# Patient Record
Sex: Female | Born: 1980 | Race: Black or African American | Hispanic: No | Marital: Single | Smoking: Current some day smoker
Health system: Southern US, Community
[De-identification: ages and names within clinical notes are randomized; demographics above are authoritative.]

## PROBLEM LIST (undated history)

## (undated) DIAGNOSIS — N83209 Unspecified ovarian cyst, unspecified side: Secondary | ICD-10-CM

## (undated) DIAGNOSIS — N159 Renal tubulo-interstitial disease, unspecified: Secondary | ICD-10-CM

---

## 2005-07-12 ENCOUNTER — Emergency Department (HOSPITAL_COMMUNITY): Admission: EM | Admit: 2005-07-12 | Discharge: 2005-07-12 | Payer: Self-pay | Admitting: Family Medicine

## 2005-07-17 ENCOUNTER — Ambulatory Visit: Payer: Self-pay | Admitting: Internal Medicine

## 2006-04-14 ENCOUNTER — Emergency Department (HOSPITAL_COMMUNITY): Admission: EM | Admit: 2006-04-14 | Discharge: 2006-04-14 | Payer: Self-pay | Admitting: Family Medicine

## 2006-07-16 ENCOUNTER — Other Ambulatory Visit: Admission: RE | Admit: 2006-07-16 | Discharge: 2006-07-16 | Payer: Self-pay | Admitting: Family Medicine

## 2006-09-17 ENCOUNTER — Inpatient Hospital Stay (HOSPITAL_COMMUNITY): Admission: AD | Admit: 2006-09-17 | Discharge: 2006-09-17 | Payer: Self-pay | Admitting: Obstetrics and Gynecology

## 2007-03-04 ENCOUNTER — Emergency Department (HOSPITAL_COMMUNITY): Admission: EM | Admit: 2007-03-04 | Discharge: 2007-03-04 | Payer: Self-pay | Admitting: Family Medicine

## 2007-04-01 ENCOUNTER — Inpatient Hospital Stay (HOSPITAL_COMMUNITY): Admission: AD | Admit: 2007-04-01 | Discharge: 2007-04-01 | Payer: Self-pay | Admitting: Obstetrics and Gynecology

## 2007-06-30 DIAGNOSIS — N83209 Unspecified ovarian cyst, unspecified side: Secondary | ICD-10-CM

## 2007-06-30 HISTORY — DX: Unspecified ovarian cyst, unspecified side: N83.209

## 2007-07-15 ENCOUNTER — Emergency Department (HOSPITAL_COMMUNITY): Admission: EM | Admit: 2007-07-15 | Discharge: 2007-07-15 | Payer: Self-pay | Admitting: Emergency Medicine

## 2007-11-03 ENCOUNTER — Emergency Department (HOSPITAL_COMMUNITY): Admission: EM | Admit: 2007-11-03 | Discharge: 2007-11-03 | Payer: Self-pay | Admitting: Family Medicine

## 2008-06-19 ENCOUNTER — Emergency Department (HOSPITAL_COMMUNITY): Admission: EM | Admit: 2008-06-19 | Discharge: 2008-06-19 | Payer: Self-pay | Admitting: Emergency Medicine

## 2008-06-29 DIAGNOSIS — N159 Renal tubulo-interstitial disease, unspecified: Secondary | ICD-10-CM

## 2008-06-29 HISTORY — DX: Renal tubulo-interstitial disease, unspecified: N15.9

## 2009-01-15 ENCOUNTER — Inpatient Hospital Stay (HOSPITAL_COMMUNITY): Admission: EM | Admit: 2009-01-15 | Discharge: 2009-01-19 | Payer: Self-pay | Admitting: Emergency Medicine

## 2009-02-27 ENCOUNTER — Emergency Department (HOSPITAL_COMMUNITY): Admission: EM | Admit: 2009-02-27 | Discharge: 2009-02-27 | Payer: Self-pay | Admitting: Family Medicine

## 2009-07-25 ENCOUNTER — Emergency Department (HOSPITAL_COMMUNITY): Admission: EM | Admit: 2009-07-25 | Discharge: 2009-07-25 | Payer: Self-pay | Admitting: Emergency Medicine

## 2010-10-05 LAB — COMPREHENSIVE METABOLIC PANEL
AST: 18 U/L (ref 0–37)
Albumin: 2.6 g/dL — ABNORMAL LOW (ref 3.5–5.2)
Albumin: 3.7 g/dL (ref 3.5–5.2)
Alkaline Phosphatase: 57 U/L (ref 39–117)
BUN: 5 mg/dL — ABNORMAL LOW (ref 6–23)
CO2: 25 mEq/L (ref 19–32)
CO2: 26 mEq/L (ref 19–32)
Calcium: 9.3 mg/dL (ref 8.4–10.5)
Creatinine, Ser: 1.01 mg/dL (ref 0.4–1.2)
Glucose, Bld: 86 mg/dL (ref 70–99)
Total Bilirubin: 0.8 mg/dL (ref 0.3–1.2)
Total Bilirubin: 1 mg/dL (ref 0.3–1.2)
Total Protein: 6.3 g/dL (ref 6.0–8.3)

## 2010-10-05 LAB — URINE CULTURE: Colony Count: >=100000

## 2010-10-05 LAB — CULTURE, BLOOD (ROUTINE X 2): Culture: NO GROWTH

## 2010-10-05 LAB — DIFFERENTIAL
Basophils Absolute: 0 10*3/uL (ref 0.0–0.1)
Basophils Absolute: 0 10*3/uL (ref 0.0–0.1)
Basophils Relative: 0 % (ref 0–1)
Eosinophils Absolute: 0 10*3/uL (ref 0.0–0.7)
Eosinophils Absolute: 0.2 10*3/uL (ref 0.0–0.7)
Eosinophils Absolute: 0.5 10*3/uL (ref 0.0–0.7)
Eosinophils Relative: 0 % (ref 0–5)
Eosinophils Relative: 4 % (ref 0–5)
Lymphocytes Relative: 10 % — ABNORMAL LOW (ref 12–46)
Lymphocytes Relative: 14 % (ref 12–46)
Lymphocytes Relative: 16 % (ref 12–46)
Lymphs Abs: 1.5 10*3/uL (ref 0.7–4.0)
Lymphs Abs: 2 10*3/uL (ref 0.7–4.0)
Lymphs Abs: 2.1 10*3/uL (ref 0.7–4.0)
Lymphs Abs: 2.2 10*3/uL (ref 0.7–4.0)
Monocytes Absolute: 1.1 10*3/uL — ABNORMAL HIGH (ref 0.1–1.0)
Monocytes Absolute: 1.1 10*3/uL — ABNORMAL HIGH (ref 0.1–1.0)
Monocytes Absolute: 1.5 10*3/uL — ABNORMAL HIGH (ref 0.1–1.0)
Monocytes Relative: 10 % (ref 3–12)
Monocytes Relative: 10 % (ref 3–12)
Neutro Abs: 11.7 10*3/uL — ABNORMAL HIGH (ref 1.7–7.7)
Neutro Abs: 8.5 10*3/uL — ABNORMAL HIGH (ref 1.7–7.7)
Neutrophils Relative %: 75 % (ref 43–77)

## 2010-10-05 LAB — URINALYSIS, ROUTINE W REFLEX MICROSCOPIC
Glucose, UA: NEGATIVE mg/dL
Specific Gravity, Urine: 1.018 (ref 1.005–1.030)
Urobilinogen, UA: >8 mg/dL — ABNORMAL HIGH (ref 0.0–1.0)
pH: 6.5 (ref 5.0–8.0)

## 2010-10-05 LAB — HEMOGLOBIN A1C: Hgb A1c MFr Bld: 5.2 % (ref 4.6–6.1)

## 2010-10-05 LAB — BASIC METABOLIC PANEL
BUN: <1 mg/dL — ABNORMAL LOW (ref 6–23)
CO2: 26 mEq/L (ref 19–32)
Chloride: 109 mEq/L (ref 96–112)
Glucose, Bld: 90 mg/dL (ref 70–99)
Potassium: 3.4 mEq/L — ABNORMAL LOW (ref 3.5–5.1)

## 2010-10-05 LAB — CBC
HCT: 30.4 % — ABNORMAL LOW (ref 36.0–46.0)
HCT: 31.6 % — ABNORMAL LOW (ref 36.0–46.0)
HCT: 40.8 % (ref 36.0–46.0)
Hemoglobin: 10.5 g/dL — ABNORMAL LOW (ref 12.0–15.0)
Hemoglobin: 10.7 g/dL — ABNORMAL LOW (ref 12.0–15.0)
MCHC: 33.8 g/dL (ref 30.0–36.0)
MCHC: 34.6 g/dL (ref 30.0–36.0)
MCV: 92.2 fL (ref 78.0–100.0)
MCV: 93.1 fL (ref 78.0–100.0)
MCV: 93.6 fL (ref 78.0–100.0)
Platelets: 226 10*3/uL (ref 150–400)
Platelets: 251 10*3/uL (ref 150–400)
RBC: 3.37 MIL/uL — ABNORMAL LOW (ref 3.87–5.11)
RBC: 4.36 MIL/uL (ref 3.87–5.11)
RDW: 14.1 % (ref 11.5–15.5)
WBC: 12 10*3/uL — ABNORMAL HIGH (ref 4.0–10.5)
WBC: 15.6 10*3/uL — ABNORMAL HIGH (ref 4.0–10.5)
WBC: 15.9 10*3/uL — ABNORMAL HIGH (ref 4.0–10.5)

## 2010-10-05 LAB — TSH: TSH: 1.747 u[IU]/mL (ref 0.350–4.500)

## 2010-11-11 NOTE — Discharge Summary (Signed)
Jillian [name], [name]           ACCOUNT NO.:  [id number]   MEDICAL RECORD NO.:  [id number]          PATIENT TYPE:  INP   LOCATION:  5522                         FACILITY:  [facility]   PHYSICIAN:  Altha Harm, MDDATE OF BIRTH:  [birth year]   DATE OF ADMISSION:  01/14/2009  DATE OF DISCHARGE:  01/19/2009                               DISCHARGE SUMMARY   DISCHARGE DISPOSITION:  Home.   DISCHARGE DIAGNOSES:  1. Acute E. coli pyelonephritis.  2. Acute E. coli bacteremia.  3. Multilobar pneumonia.  4. Hypoxia, resolved.  5. Broken blood vessel in the left conjunctiva.  6. Obesity, BMI greater than 40.   DISCHARGE MEDICATIONS:  Include the following:  1. Ciprofloxacin 500 mg p.o. q.12 h x9 days.  2. Azithromycin 500 mg p.o. daily x2 days.   CONSULTANTS:  None.   PROCEDURES:  None.   DIAGNOSTIC STUDIES:  1. Chest x-ray two view done on second day of admission which shows      bilateral lower lobe infiltrates which are likely due to multifocal      infection.  Followup imaging recommended to ensure resolution.  2. Two view chest x-ray done on July 24 on day of discharge which      shows increased opacification in the lateral right lung base.      Persistent opacification in the retrocardiac space.  Findings were      either from air space disease or pneumonia.   CODE STATUS:  Full code.   ALLERGIES:  No known drug allergies.   CHIEF COMPLAINT:  Back pain and fever.   HISTORY OF PRESENT ILLNESS:  Please refer to the H and P by Dr. Oswald Hillock for details of the HPI.  However, in short, this is a 30 year old  [place] female who presented with complaints of 2 day history  of back pain associated with dysuria, vomiting, fever and chills.   HOSPITAL COURSE:  1. The patient was diagnosed on admission with acute pyelonephritis.      She was started on ciprofloxacin for treatment of her disease.      This was changed to cefepime and then to Rocephin.  Cultures  revealed that she had E. coli that was sensitive to both Rocephin      and to ciprofloxacin.  The patient was transitioned to oral      ciprofloxacin to complete a total of 9 additional days for a total      of 14 days in light of her bacteremia with the same pathogen.  2. E. coli bacteremia.  As noted the E. coli was sensitive to both      Rocephin and ciprofloxacin.  The patient is being continued on      ciprofloxacin for a total of 14 days total.  3. Multifocal pneumonia.  The patient is being treated with      ciprofloxacin.  In addition she had azithromycin added for atypical      coverage.  The patient has completed 3 days of azithromycin and is      to complete an additional 2 days.  4. Transient hypertension.  The patient did have some values which      reflected hypertension.  However, the patient was in a state of      excitement at the time and a repeat blood pressure shows her blood      pressures to be 130.88.  Otherwise the patient has remained stable.   DIETARY RESTRICTIONS:  None.   PHYSICAL RESTRICTIONS:  None.   FOLLOWUP:  The patient is to follow up with urgent care in about a week  for repeat chest x-ray and further evaluation.  Total time for this  visit and discharge process 50 minutes.      Altha Harm, MD  Electronically Signed     MAM/MEDQ  D:  01/19/2009  T:  01/19/2009  Job:  [id number]

## 2010-11-11 NOTE — H&P (Signed)
Jillian [name], Jillian [name]           ACCOUNT NO.:  [id number]   MEDICAL RECORD NO.:  [id number]          PATIENT TYPE:  INP   LOCATION:  5522                         FACILITY:  [facility]   PHYSICIAN:  Oswald Hillock, MD        DATE OF BIRTH:  [birth year]   DATE OF ADMISSION:  01/14/2009  DATE OF DISCHARGE:                              HISTORY & PHYSICAL   CHIEF COMPLAINT:  Back pain/fever.   HISTORY OF PRESENT ILLNESS:  The patient is a 29 year old African  American female, who presents to the emergency room with a 2-day history  of back pain associated with dysuria, decrease in amount of her  urination, vomiting, fever, and chills.  She states that she has never  had symptoms like this in the past.  Denies any diarrhea, abdominal  pain, or any chest pain, shortness of breath, palpitations, dizziness,  loss of consciousness, or any focal weakness of any part of the body.   EMERGENCY ROOM COURSE:  The patient had an initial eval in the ER, which  revealed positive urinalysis.  She had a white count of 16,000 and was  febrile with temperature of 102.8.  She was subsequently started on IV  fluids and given a dose of Rocephin in the ER.  The patient however  continued to be febrile, and we were asked to admit her for further  evaluation and management.   PAST MEDICAL HISTORY:  Significant for:  1. History of abnormal Pap smear.  2. History of obesity.  3. History of headaches.  4. History of chlamydia infection.   PAST SURGICAL HISTORY:  Colposcopy of the cervix.   FAMILY HISTORY:  Significant for diabetes.   SOCIAL HISTORY:  The patient denies any alcohol, tobacco, or drug use.   CURRENT MEDICATIONS:  None.   ALLERGIES:  No known drug allergies.   REVIEW OF SYSTEMS:  An extensive review of systems was done and all  systems are negative except for the positives as mentioned in the  history of present illness.   PHYSICAL EXAMINATION:  VITAL SIGNS:  On admission, pulse of 115, blood  pressure 126/80, temperature of 102.8, her O2 sats of 100% on room air.  GENERAL:  The patient is conscious; alert and oriented to time, place,  and person; febrile in no significant distress at the time of this  interview.  HEENT:  No scleral icterus.  No pallor.  Ears, negative.  Poor dental  hygiene.  NECK:  Supple.  No lymphadenopathy.  No JVD.  No carotid bruits.  CHEST:  Clear bilaterally.  CVS:  S1 and S2 plus regular.  No tachycardia.  No gallop, rub, or  murmur appreciated.  ABDOMEN:  Obese, nontender.  Bowel sounds present.  No  hepatosplenomegaly.  BACK:  The patient has bilateral costovertebral tenderness.  EXTREMITIES:  No cyanosis, clubbing, or edema.  NEUROLOGIC:  Cranial nerves II through XII are grossly intact.  No focal  motor or sensory deficits noted on gross examination.   LABORATORY DATA:  Urinalysis revealed positive nitrites, large leuk  esterase, too numerous to count wbc's.  WBC count was 15.9,  hemoglobin  13.8, platelet count of 251, neutrophils 84%, bands 13%.  Sodium 134,  potassium 2.4, chloride 99, CO2 of 25, glucose 86, BUN 5, and creatinine  1.11, calcium of 9.3, lipase was 16.   IMPRESSION AND PLAN:  This is a case of a 30 year old [place] American  female, who presents with 2-day history of fever, dysuria, and vomiting  and is noted to have what is likely acute pyelonephritis.  1. Acute pyelonephritis.  Given her continued fever, we will admit her      to the Medical Service for further evaluation and management.  We      will start her on intravenous ciprofloxacin, follow up with culture      and sensitivity results.  Blood cultures will be drawn if they have      not been done earlier.  The patient will be maintained on IV fluids      with potassium as she is mildly hypokalemic, and we will monitor      her closely.  If she continues to be febrile, we may need to do a      CT of her abdomen and pelvis to rule out any possible renal       abnormalities including renal microabscesses.  2. Health maintenance.  We will check TSH and hemoglobin A1c and      follow up with the results.  3. Deep venous thrombosis/gastrointestinal prophylaxis.  Protonix and      subcutaneous heparin.      Oswald Hillock, MD  Electronically Signed     Oswald Hillock, MD  Electronically Signed    BA/MEDQ  D:  01/15/2009  T:  01/15/2009  Job:  [id number]   cc:   Melvern Banker

## 2011-03-19 LAB — URINALYSIS, ROUTINE W REFLEX MICROSCOPIC
Bilirubin Urine: NEGATIVE
Glucose, UA: NEGATIVE
Ketones, ur: NEGATIVE
Protein, ur: NEGATIVE

## 2011-03-19 LAB — GC/CHLAMYDIA PROBE AMP, GENITAL
Chlamydia, DNA Probe: NEGATIVE
GC Probe Amp, Genital: NEGATIVE

## 2011-03-19 LAB — WET PREP, GENITAL
Trich, Wet Prep: NONE SEEN
Yeast Wet Prep HPF POC: NONE SEEN

## 2011-04-09 LAB — URINALYSIS, ROUTINE W REFLEX MICROSCOPIC
Leukocytes, UA: NEGATIVE
Nitrite: NEGATIVE
Protein, ur: NEGATIVE
Specific Gravity, Urine: <1.005 — ABNORMAL LOW
Urobilinogen, UA: 0.2

## 2011-04-09 LAB — URINE MICROSCOPIC-ADD ON

## 2011-04-09 LAB — CBC
Hemoglobin: 13
MCHC: 33.9
Platelets: 336
RDW: 13.2

## 2011-04-09 LAB — WET PREP, GENITAL: Yeast Wet Prep HPF POC: NONE SEEN

## 2011-04-09 LAB — TYPE AND SCREEN
ABO/RH(D): A POS
Antibody Screen: NEGATIVE

## 2011-04-09 LAB — GC/CHLAMYDIA PROBE AMP, GENITAL
Chlamydia, DNA Probe: NEGATIVE
GC Probe Amp, Genital: NEGATIVE

## 2011-04-09 LAB — HCG, QUANTITATIVE, PREGNANCY: hCG, Beta Chain, Quant, S: <2

## 2011-04-10 LAB — POCT URINALYSIS DIP (DEVICE)
Nitrite: NEGATIVE
Protein, ur: NEGATIVE
pH: 6

## 2012-03-22 DIAGNOSIS — A5901 Trichomonal vulvovaginitis: Secondary | ICD-10-CM | POA: Insufficient documentation

## 2012-03-23 ENCOUNTER — Emergency Department (HOSPITAL_COMMUNITY)
Admission: EM | Admit: 2012-03-23 | Discharge: 2012-03-23 | Disposition: A | Discharge status: Home / Self Care | Payer: Self-pay | Attending: Emergency Medicine | Admitting: Emergency Medicine

## 2012-03-23 ENCOUNTER — Encounter (HOSPITAL_COMMUNITY): Payer: Self-pay | Admitting: Emergency Medicine

## 2012-03-23 DIAGNOSIS — A599 Trichomoniasis, unspecified: Secondary | ICD-10-CM

## 2012-03-23 DIAGNOSIS — B9689 Other specified bacterial agents as the cause of diseases classified elsewhere: Secondary | ICD-10-CM

## 2012-03-23 HISTORY — DX: Unspecified ovarian cyst, unspecified side: N83.209

## 2012-03-23 HISTORY — DX: Renal tubulo-interstitial disease, unspecified: N15.9

## 2012-03-23 LAB — GC/CHLAMYDIA PROBE AMP, GENITAL: GC Probe Amp, Genital: NEGATIVE

## 2012-03-23 LAB — URINALYSIS, ROUTINE W REFLEX MICROSCOPIC
Glucose, UA: NEGATIVE mg/dL
Hgb urine dipstick: NEGATIVE
Ketones, ur: NEGATIVE mg/dL
Leukocytes, UA: NEGATIVE
Protein, ur: NEGATIVE mg/dL
pH: 5.5 (ref 5.0–8.0)

## 2012-03-23 LAB — WET PREP, GENITAL: Yeast Wet Prep HPF POC: NONE SEEN

## 2012-03-23 LAB — RPR: RPR Ser Ql: NONREACTIVE

## 2012-03-23 MED ORDER — METRONIDAZOLE 500 MG PO TABS
2000.0000 mg | ORAL_TABLET | Freq: Once | ORAL | Status: AC
  Administered 2012-03-23: 2000 mg via ORAL
  Filled 2012-03-23: qty 4

## 2012-03-23 MED ORDER — AZITHROMYCIN 250 MG PO TABS
1000.0000 mg | ORAL_TABLET | Freq: Once | ORAL | Status: AC
  Administered 2012-03-23: 1000 mg via ORAL
  Filled 2012-03-23: qty 4

## 2012-03-23 MED ORDER — CEFTRIAXONE SODIUM 250 MG IJ SOLR
250.0000 mg | Freq: Once | INTRAMUSCULAR | Status: AC
  Administered 2012-03-23: 250 mg via INTRAMUSCULAR
  Filled 2012-03-23: qty 250

## 2012-03-23 MED ORDER — ACETAMINOPHEN 325 MG PO TABS
650.0000 mg | ORAL_TABLET | Freq: Once | ORAL | Status: AC
  Administered 2012-03-23: 650 mg via ORAL
  Filled 2012-03-23: qty 2
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
## 2012-03-23 NOTE — ED Provider Notes (Signed)
History     CSN: [id number]  Arrival date & time 03/22/12  2325   First MD Initiated Contact with Patient 03/23/12 0129      Chief Complaint  Patient presents with  . Flank Pain    (Consider location/radiation/quality/duration/timing/severity/associated sxs/prior treatment) HPI  Pt to the ER with complaints of left lower abdominal pain and urinary urgency. Her pain is 8/10. SHe says that her bf was recently told by her bf that he cheated on her and she is afraid that she may have an STD. She denies having vaginal discharge. NO N/V/D/F, chills weakness or confusion. VSS/NAD.  Past Medical History  Diagnosis Date  . Ovarian cyst rupture 2009  . Kidney infection 2010    History reviewed. No pertinent past surgical history.  History reviewed. No pertinent family history.  History  Substance Use Topics  . Smoking status: Current Some Day Smoker  . Smokeless tobacco: Not on file   Comment: 3 cigarettes / day  . Alcohol Use: No    OB History    Grav Para Term Preterm Abortions TAB SAB Ect Mult Living                  Review of Systems   Review of Systems  Gen: no weight loss, fevers, chills, night sweats  Eyes: no discharge or drainage, no occular pain or visual changes  Nose: no epistaxis or rhinorrhea  Mouth: no dental pain, no sore throat  Neck: no neck pain  Lungs:No wheezing, coughing or hemoptysis CV: no chest pain, palpitations, dependent edema or orthopnea  Abd: no abdominal pain, nausea, vomiting  GU: no dysuria or gross hematuria, + urinary frequency MSK:  No abnormalities  Neuro: no headache, no focal neurologic deficits  Skin: no abnormalities Psyche: negative.    Allergies  Review of patient's allergies indicates no known allergies.  Home Medications   Current Outpatient Rx  Name Route Sig Dispense Refill  . ASPIRIN-SALICYLAMIDE-CAFFEINE 650-195-33.3 MG PO PACK Oral Take 1 Package by mouth every 8 (eight) hours as needed. Pain    . PRENATAL  MULTIVITAMIN CH Oral Take 1 tablet by mouth daily.      BP 126/76  Pulse 78  Temp 98.4 F (36.9 C) (Oral)  Resp 24  Ht 5\' 3"  (1.6 m)  SpO2 100%  LMP 03/11/2012  Physical Exam  Nursing note and vitals reviewed. Constitutional: She appears well-developed and well-nourished. No distress.  HENT:  Head: Normocephalic and atraumatic.  Eyes: Pupils are equal, round, and reactive to light.  Neck: Normal range of motion. Neck supple.  Cardiovascular: Normal rate and regular rhythm.   Pulmonary/Chest: Effort normal.  Abdominal: Soft.  Genitourinary: Uterus normal. Cervix exhibits no motion tenderness, no discharge and no friability. Right adnexum displays no mass, no tenderness and no fullness. Left adnexum displays no mass, no tenderness and no fullness. No erythema, tenderness or bleeding around the vagina. No foreign body around the vagina. No signs of injury around the vagina. Vaginal discharge found.  Neurological: She is alert.  Skin: Skin is warm and dry.    ED Course  Procedures (including critical care time)  Labs Reviewed  URINALYSIS, ROUTINE W REFLEX MICROSCOPIC - Abnormal; Notable for the following:    APPearance CLOUDY (*)     All other components within normal limits  WET PREP, GENITAL - Abnormal; Notable for the following:    Trich, Wet Prep MANY (*)     Clue Cells Wet Prep HPF POC FEW (*)  WBC, Wet Prep HPF POC FEW (*)     All other components within normal limits  GC/CHLAMYDIA PROBE AMP, GENITAL  RPR   No results found.   1. Trichimoniasis   2. BV (bacterial vaginosis)       MDM  + trich, + WBC  and + clue cells. Pt treated in ED with rocephin 250mg  IM, 2mg  PO flagyl and 1g PO Azithro. Told she will be called in 2 days if GC or syph tests come back positive.  Pt has been advised of the symptoms that warrant their return to the ED. Patient has voiced understanding and has agreed to follow-up with the PCP or specialist.         Dorthula Matas,  PA 03/23/12 0345

## 2012-03-23 NOTE — ED Notes (Signed)
Abd/ back pain x 1 week.

## 2012-03-24 NOTE — ED Provider Notes (Signed)
Medical screening examination/treatment/procedure(s) were performed by non-physician practitioner and as supervising physician I was immediately available for consultation/collaboration.  Dilyn Osoria, MD 03/24/12 0024 

## 2012-06-12 ENCOUNTER — Emergency Department (HOSPITAL_COMMUNITY): Payer: No Typology Code available for payment source

## 2012-06-12 ENCOUNTER — Emergency Department (HOSPITAL_COMMUNITY)
Admission: EM | Admit: 2012-06-12 | Discharge: 2012-06-12 | Disposition: A | Discharge status: Home / Self Care | Payer: No Typology Code available for payment source | Attending: Emergency Medicine | Admitting: Emergency Medicine

## 2012-06-12 ENCOUNTER — Encounter (HOSPITAL_COMMUNITY): Payer: Self-pay | Admitting: *Deleted

## 2012-06-12 DIAGNOSIS — M7918 Myalgia, other site: Secondary | ICD-10-CM

## 2012-06-12 DIAGNOSIS — M25519 Pain in unspecified shoulder: Secondary | ICD-10-CM | POA: Insufficient documentation

## 2012-06-12 DIAGNOSIS — Z8742 Personal history of other diseases of the female genital tract: Secondary | ICD-10-CM | POA: Insufficient documentation

## 2012-06-12 DIAGNOSIS — Y92410 Unspecified street and highway as the place of occurrence of the external cause: Secondary | ICD-10-CM | POA: Insufficient documentation

## 2012-06-12 DIAGNOSIS — F17200 Nicotine dependence, unspecified, uncomplicated: Secondary | ICD-10-CM | POA: Insufficient documentation

## 2012-06-12 DIAGNOSIS — Z87448 Personal history of other diseases of urinary system: Secondary | ICD-10-CM | POA: Insufficient documentation

## 2012-06-12 DIAGNOSIS — R209 Unspecified disturbances of skin sensation: Secondary | ICD-10-CM | POA: Insufficient documentation

## 2012-06-12 DIAGNOSIS — Y9389 Activity, other specified: Secondary | ICD-10-CM | POA: Insufficient documentation

## 2012-06-12 MED ORDER — OXYCODONE-ACETAMINOPHEN 5-325 MG PO TABS: ORAL_TABLET | ORAL | Status: DC

## 2012-06-12 NOTE — ED Notes (Signed)
Pt escorted to discharge window. Pt verbalized understanding discharge instructions. In no acute distress.  
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
## 2012-06-12 NOTE — ED Provider Notes (Signed)
Medical screening examination/treatment/procedure(s) were performed by non-physician practitioner and as supervising physician I was immediately available for consultation/collaboration.  Gerhard Munch, MD 06/12/12 [phone]

## 2012-10-15 ENCOUNTER — Encounter (HOSPITAL_COMMUNITY): Payer: Self-pay | Admitting: Emergency Medicine

## 2012-10-15 ENCOUNTER — Emergency Department (HOSPITAL_COMMUNITY)
Admission: EM | Admit: 2012-10-15 | Discharge: 2012-10-15 | Disposition: A | Discharge status: Home / Self Care | Payer: BC Managed Care – PPO | Source: Home / Self Care

## 2012-10-15 DIAGNOSIS — Z189 Retained foreign body fragments, unspecified material: Secondary | ICD-10-CM

## 2012-10-15 DIAGNOSIS — H44731 Retained (nonmagnetic) (old) foreign body in lens, right eye: Secondary | ICD-10-CM

## 2012-10-15 DIAGNOSIS — S0550XA Penetrating wound with foreign body of unspecified eyeball, initial encounter: Secondary | ICD-10-CM

## 2012-10-15 MED ORDER — TETRACAINE HCL 0.5 % OP SOLN
OPHTHALMIC | Status: AC
  Filled 2012-10-15: qty 2

## 2012-10-15 MED ORDER — POLYMYXIN B-TRIMETHOPRIM 10000-0.1 UNIT/ML-% OP SOLN: 1.0000 [drp] | OPHTHALMIC | Status: DC

## 2012-10-15 NOTE — ED Provider Notes (Signed)
Medical screening examination/treatment/procedure(s) were performed by non-physician practitioner and as supervising physician I was immediately available for consultation/collaboration.  Reyana Leisey, M.D.  Rashea Hoskie C Maygen Sirico, MD 10/15/12 1857 

## 2012-10-15 NOTE — ED Notes (Signed)
Pt c/o poss contact lens lost inside right eye "feels like an eye lash is stuck inside her eye" Denies blurry vision, f/v/n/d  She is alert and oriented w/no signs of acute distress.

## 2012-10-15 NOTE — ED Provider Notes (Signed)
History     CSN: [id number]  Arrival date & time 10/15/12  1234   None     Chief Complaint  Patient presents with  . Foreign Body in Eye    (Consider location/radiation/quality/duration/timing/severity/associated sxs/prior treatment) HPI Comments: 32 year old female states that 2 days ago he she believed her right eye contact was loose in her eye. She contents that the sensation of the contact remains in her eye. She is here to have it removed. She denies pain or vision changes. She states it feels like contact is moving around in her eye.   Past Medical History  Diagnosis Date  . Ovarian cyst rupture 2009  . Kidney infection 2010    History reviewed. No pertinent past surgical history.  No family history on file.  History  Substance Use Topics  . Smoking status: Current Some Day Smoker  . Smokeless tobacco: Not on file     Comment: 3 cigarettes / day  . Alcohol Use: No    OB History   Grav Para Term Preterm Abortions TAB SAB Ect Mult Living                  Review of Systems  Constitutional: Negative.   HENT: Negative.   Eyes: Positive for redness. Negative for pain, discharge, itching and visual disturbance.  Gastrointestinal: Negative.   Psychiatric/Behavioral: The patient is nervous/anxious.     Allergies  Review of patient's allergies indicates no known allergies.  Home Medications   Current Outpatient Rx  Name  Route  Sig  Dispense  Refill  . ibuprofen (ADVIL,MOTRIN) 200 MG tablet   Oral   Take 400 mg by mouth every 6 (six) hours as needed. PAIN         . oxyCODONE-acetaminophen (PERCOCET/ROXICET) 5-325 MG per tablet      1 to 2 tabs PO q6hrs  PRN for pain   15 tablet   0   . trimethoprim-polymyxin b (POLYTRIM) ophthalmic solution   Right Eye   Place 1 drop into the right eye every 4 (four) hours.   10 mL   0     BP 123/73  Pulse 89  Temp(Src) 98.2 F (36.8 C) (Oral)  Resp 16  SpO2 100%  LMP 09/27/2012  Physical Exam  Nursing  note and vitals reviewed. Constitutional: She is oriented to person, place, and time. She appears well-developed and well-nourished. No distress.  Eyes:  Minor conjunctival erythema and scleral injection. The upper lid was everted and no foreign body seen under magnification. The same to the lower lid. The entire globe was examined prior to staining and after staining. No uptake observed no foreign body or contact lens observed.  Cardiovascular: Normal rate.   Pulmonary/Chest: Effort normal.  Neurological: She is alert and oriented to person, place, and time.  Skin: Skin is warm and dry.    ED Course  Irrigation Date/Time: 10/15/2012 3:20 PM Performed by: Phineas Real Sunny Aguon Authorized by: Phineas Real, Elvena Oyer Consent: Verbal consent obtained. Risks and benefits: risks, benefits and alternatives were discussed Consent given by: patient Patient understanding: patient states understanding of the procedure being performed Patient identity confirmed: arm band Local anesthesia used: yes Local anesthetic: topical anesthetic Patient sedated: no Comments: Post fluoriscine  application the right eye was irrigated with eyewash. The patient had a difficult time cooperating with the irrigation as she was quite anxious and fearful of something in her eye. I was able to urinate with approximately 30-40 cc prior to the patient stating that  she could not tolerate it anymore. A note regarding observation under for seen. No uptake visualized. No abrasions observed. No foreign body uptake such as a contact me and. No foreign body visualized.   (including critical care time)  Labs Reviewed - No data to display No results found.   1. 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 After applying fluorescing stain and under magnification again, no foreign body or contact lens was observed. Polytrim one drop in the right eye every 4 hours. For any worsening new symptoms or  problems may return, otherwise followup with your optometrist early this week.        Hayden Rasmussen, NP 10/15/12 [phone]

## 2013-01-08 ENCOUNTER — Emergency Department (HOSPITAL_COMMUNITY)
Admission: EM | Admit: 2013-01-08 | Discharge: 2013-01-08 | Disposition: A | Discharge status: Home / Self Care | Payer: BC Managed Care – PPO | Source: Home / Self Care

## 2013-01-08 ENCOUNTER — Encounter (HOSPITAL_COMMUNITY): Payer: Self-pay | Admitting: *Deleted

## 2013-01-08 DIAGNOSIS — G44209 Tension-type headache, unspecified, not intractable: Secondary | ICD-10-CM

## 2013-01-08 LAB — POCT URINALYSIS DIP (DEVICE)
Glucose, UA: NEGATIVE mg/dL
Hgb urine dipstick: NEGATIVE
Nitrite: NEGATIVE
Urobilinogen, UA: 0.2 mg/dL (ref 0.0–1.0)
pH: 7.5 (ref 5.0–8.0)

## 2013-01-08 MED ORDER — BUTALBITAL-APAP-CAFFEINE 50-325-40 MG PO TABS: 1.0000 | ORAL_TABLET | Freq: Four times a day (QID) | ORAL | Status: DC | PRN

## 2013-01-08 MED ORDER — KETOROLAC TROMETHAMINE 60 MG/2ML IM SOLN
60.0000 mg | Freq: Once | INTRAMUSCULAR | Status: AC
  Administered 2013-01-08: 60 mg via INTRAMUSCULAR

## 2013-01-08 MED ORDER — KETOROLAC TROMETHAMINE 60 MG/2ML IM SOLN
INTRAMUSCULAR | Status: AC
Start: 2013-01-08 — End: 2013-01-08
  Filled 2013-01-08: qty 2

## 2013-01-08 NOTE — ED Provider Notes (Signed)
History    CSN: [id number] Arrival date & time 01/08/13  1310  First MD Initiated Contact with Patient 01/08/13 1409     Chief Complaint  Patient presents with  . Urinary Tract Infection  . Headache   (Consider location/radiation/quality/duration/timing/severity/associated sxs/prior Treatment) HPI Comments: 32 year old female complaining of headache for one week. It has been intermittent however for the past 3 days but worse. Seemed to start out in the posterior cervical musculature, radiate superiorly to the occiput and into the vertex and frontal lobes. She denies problems with speech, hearing, swallowing or vision. Denies focal paresthesias or weakness. Denies photophobia, nausea, vomiting or abdominal pain. No history of migraine-type headaches.  Second complaint is that her urine has a bad odor. And is cloudy. She denies dysuria, urinary frequency or urgency.  Past Medical History  Diagnosis Date  . Ovarian cyst rupture 2009  . Kidney infection 2010   History reviewed. No pertinent past surgical history. No family history on file. History  Substance Use Topics  . Smoking status: Current Some Day Smoker  . Smokeless tobacco: Not on file     Comment: 3 cigarettes / day  . Alcohol Use: No   OB History   Grav Para Term Preterm Abortions TAB SAB Ect Mult Living                 Review of Systems  Constitutional: Negative.  Negative for fever.  HENT: Negative for congestion, sore throat, rhinorrhea, trouble swallowing and postnasal drip.   Respiratory: Negative.   Gastrointestinal: Negative.  Negative for nausea, vomiting and abdominal pain.  Genitourinary: Negative for dysuria, frequency and pelvic pain.  Musculoskeletal: Positive for back pain.  Skin: Negative.   Neurological: Positive for headaches. Negative for dizziness, tremors, seizures, syncope, facial asymmetry, speech difficulty, weakness and numbness.    Allergies  Review of patient's allergies indicates no  known allergies.  Home Medications   Current Outpatient Rx  Name  Route  Sig  Dispense  Refill  . ibuprofen (ADVIL,MOTRIN) 200 MG tablet   Oral   Take 400 mg by mouth every 6 (six) hours as needed. PAIN         . butalbital-acetaminophen-caffeine (FIORICET) 50-325-40 MG per tablet   Oral   Take 1-2 tablets by mouth every 6 (six) hours as needed for pain or headache.   6 tablet   0   . oxyCODONE-acetaminophen (PERCOCET/ROXICET) 5-325 MG per tablet      1 to 2 tabs PO q6hrs  PRN for pain   15 tablet   0   . trimethoprim-polymyxin b (POLYTRIM) ophthalmic solution   Right Eye   Place 1 drop into the right eye every 4 (four) hours.   10 mL   0    BP 125/76  Pulse 83  Temp(Src) 97.9 F (36.6 C) (Oral)  Resp 16  SpO2 100%  LMP 12/23/2012 Physical Exam  Nursing note and vitals reviewed. Constitutional: She is oriented to person, place, and time. She appears well-developed and well-nourished. No distress.  HENT:  Mouth/Throat: Oropharynx is clear and moist.  Bilateral TMs are normal Soft palate rises symmetrically  Eyes: Conjunctivae and EOM are normal. Pupils are equal, round, and reactive to light.  Neck: Normal range of motion. Neck supple.  Cardiovascular: Normal rate, normal heart sounds and intact distal pulses.   No murmur heard. Pulmonary/Chest: Effort normal and breath sounds normal. No respiratory distress. She has no wheezes. She has no rales.  Musculoskeletal: Normal range of  motion. She exhibits no edema.  Lymphadenopathy:    She has no cervical adenopathy.  Neurological: She is alert and oriented to person, place, and time. No cranial nerve deficit. She exhibits normal muscle tone.  Skin: Skin is warm and dry.  Psychiatric: She has a normal mood and affect.    ED Course  Procedures (including critical care time) Labs Reviewed  POCT URINALYSIS DIP (DEVICE)   No results found. 1. Headache, tension-type     MDM  Examination including the  neurological exam is within normal limits. No positive findings. Urinalysis is clear the patient did not have urinary symptoms. I suspect the other is coming from poor feminine hygiene. Toradol 60 mg IM Fioricet one to 2 every 6 hours when necessary headache #6 only. This medication will cause drowsiness. Do not drive or operate machinery within 4 hours of taking the medication. Must followup with primary care doctor; if you did not have one call the above telephone numbers to obtain one. Recheck promptly for any new symptoms problems or worsening.  Hayden Rasmussen, NP 01/08/13 1456
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
## 2013-01-08 NOTE — ED Notes (Signed)
Patient unable to give urine sample;  Patient given cup of water 

## 2013-01-09 NOTE — ED Provider Notes (Signed)
Medical screening examination/treatment/procedure(s) were performed by resident physician or non-physician practitioner and as supervising physician I was immediately available for consultation/collaboration.   Barkley Bruns MD.   Linna Hoff, MD 01/09/13 1341

## 2013-08-19 ENCOUNTER — Emergency Department (HOSPITAL_COMMUNITY)
Admission: EM | Admit: 2013-08-19 | Discharge: 2013-08-19 | Disposition: A | Discharge status: Home / Self Care | Payer: BC Managed Care – PPO | Source: Home / Self Care | Attending: Emergency Medicine | Admitting: Emergency Medicine

## 2013-08-19 ENCOUNTER — Encounter (HOSPITAL_COMMUNITY): Payer: Self-pay | Admitting: Emergency Medicine

## 2013-08-19 DIAGNOSIS — M549 Dorsalgia, unspecified: Secondary | ICD-10-CM

## 2013-08-19 MED ORDER — TRAMADOL HCL 50 MG PO TABS: 50.0000 mg | ORAL_TABLET | Freq: Four times a day (QID) | ORAL | Status: DC | PRN

## 2013-08-19 NOTE — Discharge Instructions (Signed)
Back Pain, Adult Low back pain is very common. About 1 in 5 people have back pain.The cause of low back pain is rarely dangerous. The pain often gets better over time.About half of people with a sudden onset of back pain feel better in just 2 weeks. About 8 in 10 people feel better by 6 weeks.  CAUSES Some common causes of back pain include:  Strain of the muscles or ligaments supporting the spine.  Wear and tear (degeneration) of the spinal discs.  Arthritis.  Direct injury to the back. DIAGNOSIS Most of the time, the direct cause of low back pain is not known.However, back pain can be treated effectively even when the exact cause of the pain is unknown.Answering your caregiver's questions about your overall health and symptoms is one of the most accurate ways to make sure the cause of your pain is not dangerous. If your caregiver needs more information, he or she may order lab work or imaging tests (X-rays or MRIs).However, even if imaging tests show changes in your back, this usually does not require surgery. HOME CARE INSTRUCTIONS For many people, back pain returns.Since low back pain is rarely dangerous, it is often a condition that people can learn to manageon their own.   Remain active. It is stressful on the back to sit or stand in one place. Do not sit, drive, or stand in one place for more than 30 minutes at a time. Take short walks on level surfaces as soon as pain allows.Try to increase the length of time you walk each day.  Do not stay in bed.Resting more than 1 or 2 days can delay your recovery.  Do not avoid exercise or work.Your body is made to move.It is not dangerous to be active, even though your back may hurt.Your back will likely heal faster if you return to being active before your pain is gone.  Pay attention to your body when you bend and lift. Many people have less discomfortwhen lifting if they bend their knees, keep the load close to their bodies,and  avoid twisting. Often, the most comfortable positions are those that put less stress on your recovering back.  Find a comfortable position to sleep. Use a firm mattress and lie on your side with your knees slightly bent. If you lie on your back, put a pillow under your knees.  Only take over-the-counter or prescription medicines as directed by your caregiver. Over-the-counter medicines to reduce pain and inflammation are often the most helpful.Your caregiver may prescribe muscle relaxant drugs.These medicines help dull your pain so you can more quickly return to your normal activities and healthy exercise.  Put ice on the injured area.  Put ice in a plastic bag.  Place a towel between your skin and the bag.  Leave the ice on for 15-20 minutes, 03-04 times a day for the first 2 to 3 days. After that, ice and heat may be alternated to reduce pain and spasms.  Ask your caregiver about trying back exercises and gentle massage. This may be of some benefit.  Avoid feeling anxious or stressed.Stress increases muscle tension and can worsen back pain.It is important to recognize when you are anxious or stressed and learn ways to manage it.Exercise is a great option. SEEK MEDICAL CARE IF:  You have pain that is not relieved with rest or medicine.  You have pain that does not improve in 1 week.  You have new symptoms.  You are generally not feeling well. SEEK   IMMEDIATE MEDICAL CARE IF:   You have pain that radiates from your back into your legs.  You develop new bowel or bladder control problems.  You have unusual weakness or numbness in your arms or legs.  You develop nausea or vomiting.  You develop abdominal pain.  You feel faint. Document Released: 06/15/2005 Document Revised: 12/15/2011 Document Reviewed: 11/03/2010 Exit[employer] Patient Information 2014 [employer].  

## 2013-08-19 NOTE — ED Notes (Signed)
Reports slipping on ice on 2/18, tweaking back.  C/O ongoing left upper back pain.  Ibuprofen and Icy-Hot not working.

## 2013-08-19 NOTE — ED Provider Notes (Signed)
CSN: [id number]     Arrival date & time 08/19/13  1344 History   First MD Initiated Contact with Patient 08/19/13 1359     Chief Complaint  Patient presents with  . Back Pain     (Consider location/radiation/quality/duration/timing/severity/associated sxs/prior Treatment) HPI Comments: 33 year old female presents for evaluation of back pain. 3 days ago, she slipped on some ice and fell flat on her back. She did not have very severe pain immediately, but over the last 2 days the pain has gotten worse. She has pain localized around the left shoulder blade area that is made worse with any pushing or pulling activities. The pain is localized and nonradiating. She has no history of injuries to this area and has no other injuries at this time. She denies any systemic symptoms. She has tried using icy hot and taking ibuprofen for the pain which has not helped.  Patient is a 33 y.o. female presenting with back pain.  Back Pain Associated symptoms: no abdominal pain, no chest pain, no dysuria, no fever and no weakness     Past Medical History  Diagnosis Date  . Ovarian cyst rupture 2009  . Kidney infection 2010   History reviewed. No pertinent past surgical history. No family history on file. History  Substance Use Topics  . Smoking status: Former Games developer  . Smokeless tobacco: Not on file  . Alcohol Use: No   OB History   Grav Para Term Preterm Abortions TAB SAB Ect Mult Living                 Review of Systems  Constitutional: Negative for fever and chills.  Eyes: Negative for visual disturbance.  Respiratory: Negative for cough and shortness of breath.   Cardiovascular: Negative for chest pain, palpitations and leg swelling.  Gastrointestinal: Negative for nausea, vomiting and abdominal pain.  Endocrine: Negative for polydipsia and polyuria.  Genitourinary: Negative for dysuria, urgency and frequency.  Musculoskeletal: Positive for back pain. Negative for arthralgias and myalgias.    Skin: Negative for rash.  Neurological: Negative for dizziness, weakness and light-headedness.      Allergies  Review of patient's allergies indicates no known allergies.  Home Medications   Current Outpatient Rx  Name  Route  Sig  Dispense  Refill  . ibuprofen (ADVIL,MOTRIN) 200 MG tablet   Oral   Take 400 mg by mouth every 6 (six) hours as needed. PAIN         . butalbital-acetaminophen-caffeine (FIORICET) 50-325-40 MG per tablet   Oral   Take 1-2 tablets by mouth every 6 (six) hours as needed for pain or headache.   6 tablet   0   . oxyCODONE-acetaminophen (PERCOCET/ROXICET) 5-325 MG per tablet      1 to 2 tabs PO q6hrs  PRN for pain   15 tablet   0   . traMADol (ULTRAM) 50 MG tablet   Oral   Take 1 tablet (50 mg total) by mouth every 6 (six) hours as needed.   15 tablet   0   . trimethoprim-polymyxin b (POLYTRIM) ophthalmic solution   Right Eye   Place 1 drop into the right eye every 4 (four) hours.   10 mL   0    BP 123/85  Pulse 90  Temp(Src) 98 F (36.7 C) (Oral)  Resp 18  SpO2 99%  LMP 07/30/2013 Physical Exam  Nursing note and vitals reviewed. Constitutional: She is oriented to person, place, and time. Vital signs are normal. She  appears well-developed and well-nourished. No distress.  HENT:  Head: Normocephalic and atraumatic.  Pulmonary/Chest: Effort normal. No respiratory distress.  Musculoskeletal:       Thoracic back: She exhibits tenderness and pain. She exhibits normal range of motion, no bony tenderness, no swelling, no deformity and no spasm.       Back:  Neurological: She is alert and oriented to person, place, and time. She has normal strength. Coordination normal.  Skin: Skin is warm and dry. No rash noted. She is not diaphoretic.  Psychiatric: She has a normal mood and affect. Judgment normal.    ED Course  Procedures (including critical care time) Labs Review Labs Reviewed - No data to display Imaging Review No results  found.    MDM   Final diagnoses:  Back pain   Musculoskeletal contusion/back pain. Treat when necessary tramadol, advised rest for a couple of days. Followup when necessary. Do not suspect anything more severe such as scapular fracture at this time due to only moderate tenderness that is not worse over bony areas   Meds ordered this encounter  Medications  . traMADol (ULTRAM) 50 MG tablet    Sig: Take 1 tablet (50 mg total) by mouth every 6 (six) hours as needed.    Dispense:  15 tablet    Refill:  0    Order Specific Question:  Supervising Provider    Answer:  Lorenz CoasterKELLER, DAVID C [[id number]]       Graylon GoodZachary H Cindia Hustead, PA-C 08/19/13 [phone]

## 2013-08-20 NOTE — ED Provider Notes (Signed)
Medical screening examination/treatment/procedure(s) were performed by non-physician practitioner and as supervising physician I was immediately available for consultation/collaboration.  Leslee Homeavid Eilidh Marcano, M.D.  Reuben Likesavid C Shamia Uppal, MD 08/20/13 1240

## 2013-12-20 ENCOUNTER — Other Ambulatory Visit (HOSPITAL_COMMUNITY): Payer: Self-pay | Admitting: Obstetrics & Gynecology

## 2013-12-20 DIAGNOSIS — Z3141 Encounter for fertility testing: Secondary | ICD-10-CM

## 2013-12-28 ENCOUNTER — Ambulatory Visit (HOSPITAL_COMMUNITY)
Admission: RE | Admit: 2013-12-28 | Discharge: 2013-12-28 | Disposition: A | Discharge status: Home / Self Care | Payer: BC Managed Care – PPO | Source: Ambulatory Visit | Attending: Obstetrics & Gynecology | Admitting: Obstetrics & Gynecology

## 2013-12-28 DIAGNOSIS — N7013 Chronic salpingitis and oophoritis: Secondary | ICD-10-CM | POA: Insufficient documentation

## 2013-12-28 DIAGNOSIS — N979 Female infertility, unspecified: Secondary | ICD-10-CM | POA: Insufficient documentation

## 2013-12-28 DIAGNOSIS — Z3141 Encounter for fertility testing: Secondary | ICD-10-CM

## 2013-12-28 MED ORDER — IOHEXOL 300 MG/ML  SOLN
20.0000 mL | Freq: Once | INTRAMUSCULAR | Status: AC | PRN
  Administered 2013-12-28: 5 mL

## 2015-02-03 IMAGING — RF DG HYSTEROGRAM
8 series · 8 of 8 positions shown · IV contrast (omnipaque)
Comparison: None.

FLUOROSCOPY TIME:  1 min 0 seconds

CLINICAL DATA: Primary infertility.

EXAM:
HYSTEROSALPINGOGRAM
TECHNIQUE: Following cleansing of the cervix and vagina with Betadine solution,
a hysterosalpingogram was performed using a 5-French
hysterosalpingogram catheter and Omnipaque 300 contrast. The patient
tolerated the examination without difficulty.

[Series 1: run · 1 of 1 slices shown (1 of 8)]
[im 1/1]
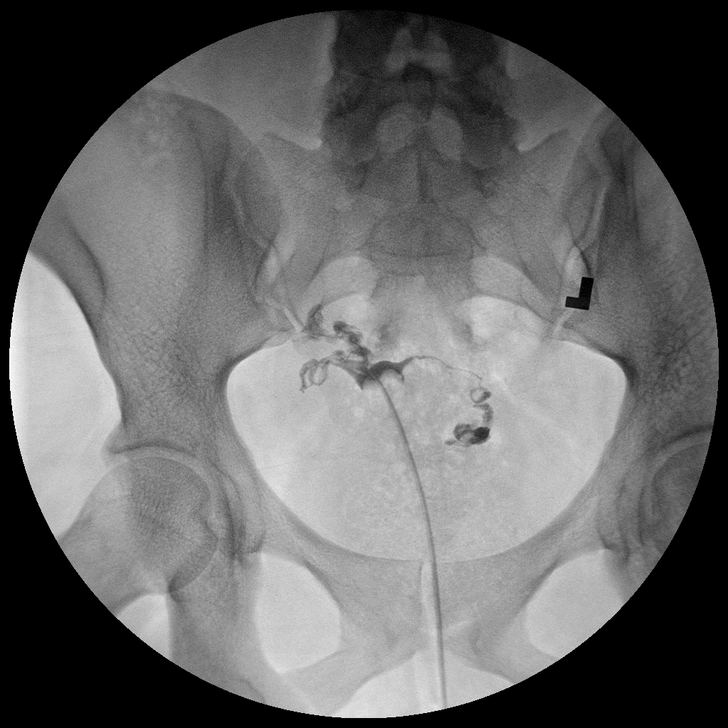

[Series 2: run · 1 of 1 slices shown (2 of 8)]
[im 1/1]
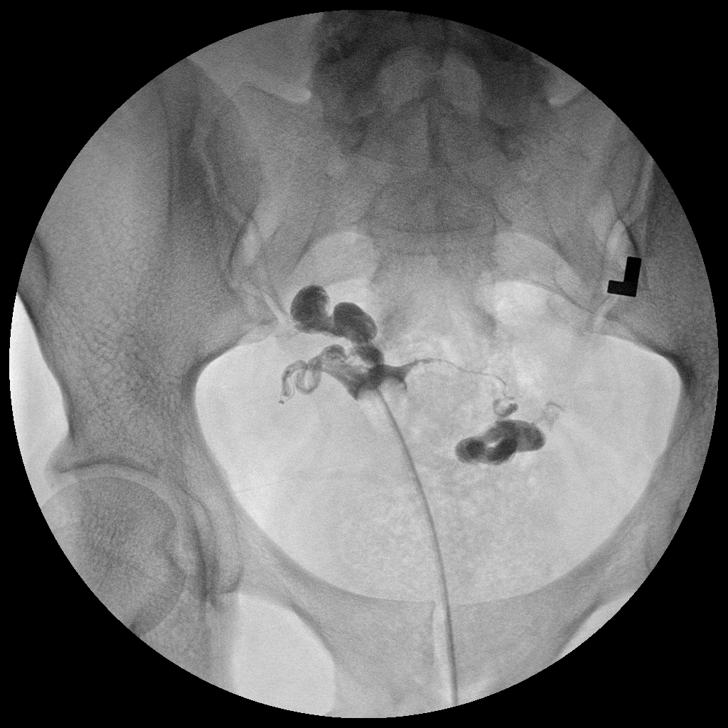

[Series 3: run · 1 of 1 slices shown (3 of 8)]
[im 1/1]
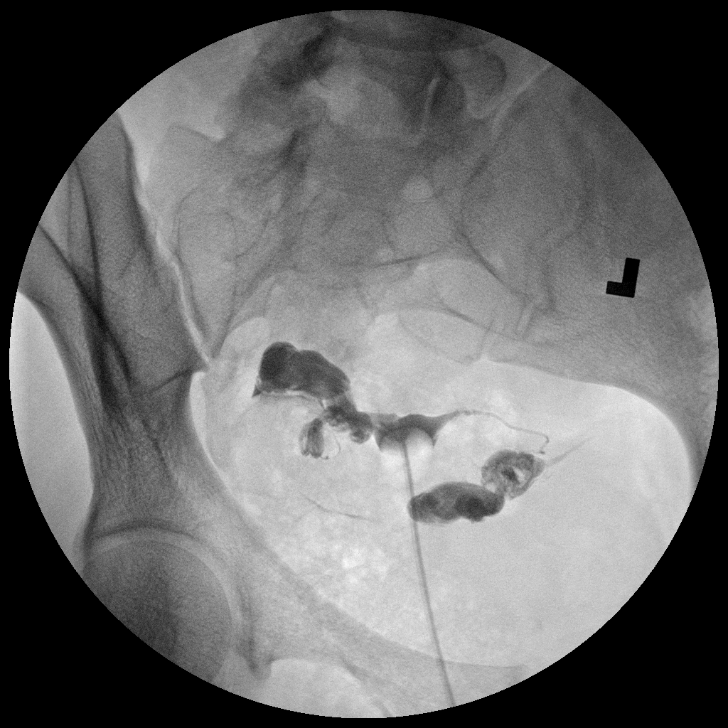

[Series 4: run · 1 of 1 slices shown (4 of 8)]
[im 1/1]
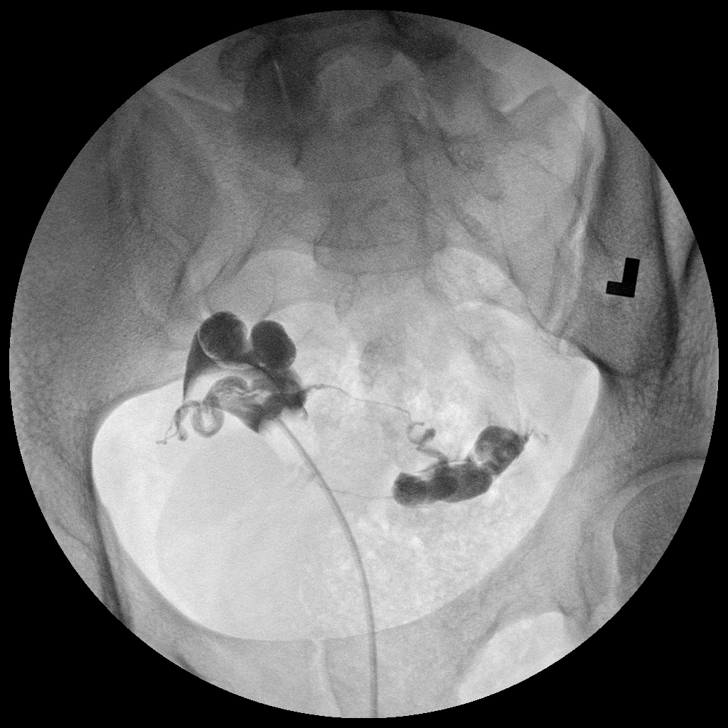

[Series 5: run · 1 of 1 slices shown (5 of 8)]
[im 1/1]
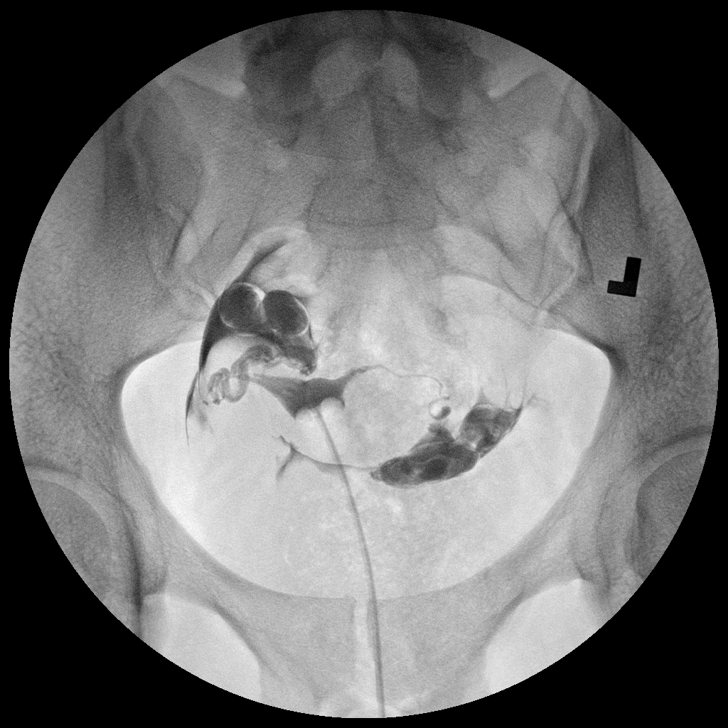

[Series 6: run · 1 of 1 slices shown (6 of 8)]
[im 1/1]
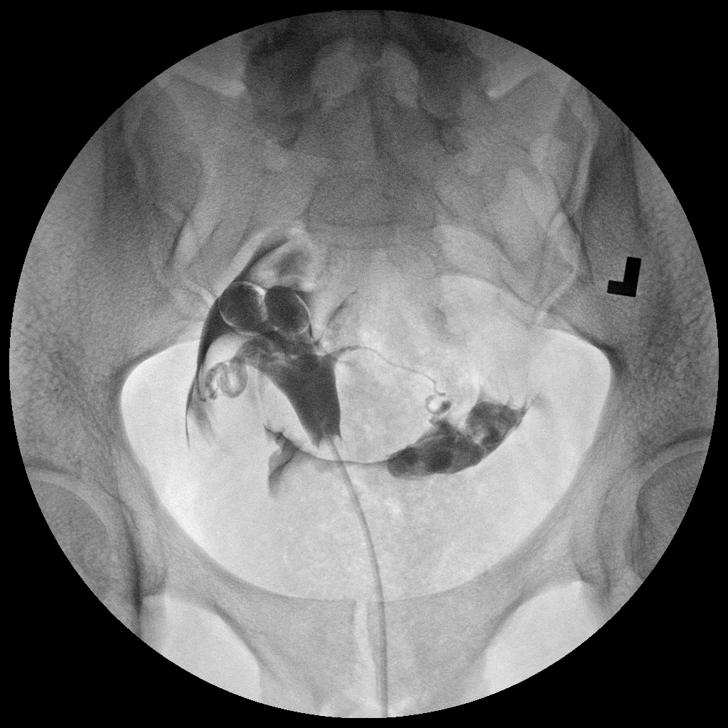

[Series 7: run · 1 of 1 slices shown (7 of 8)]
[im 1/1]
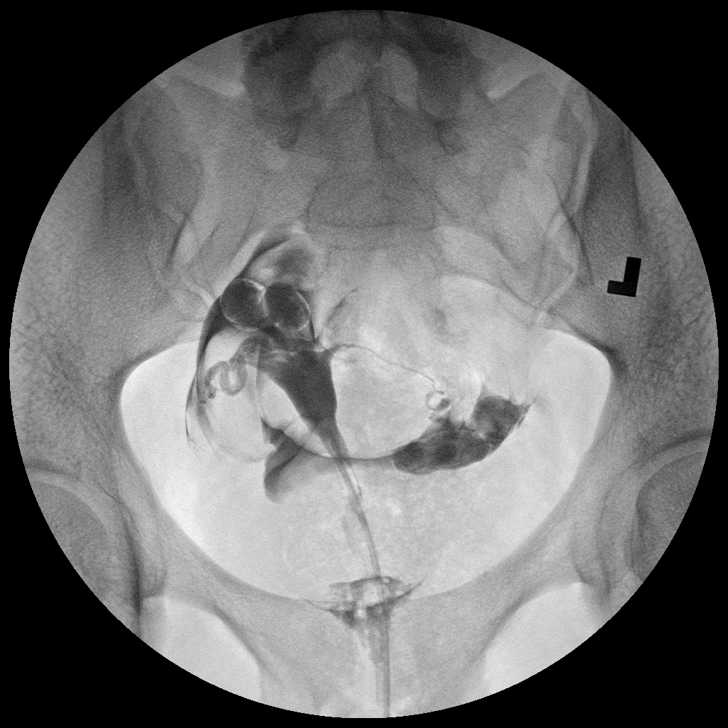

[Series 8: run · 1 of 1 slices shown (8 of 8)]
[im 1/1]
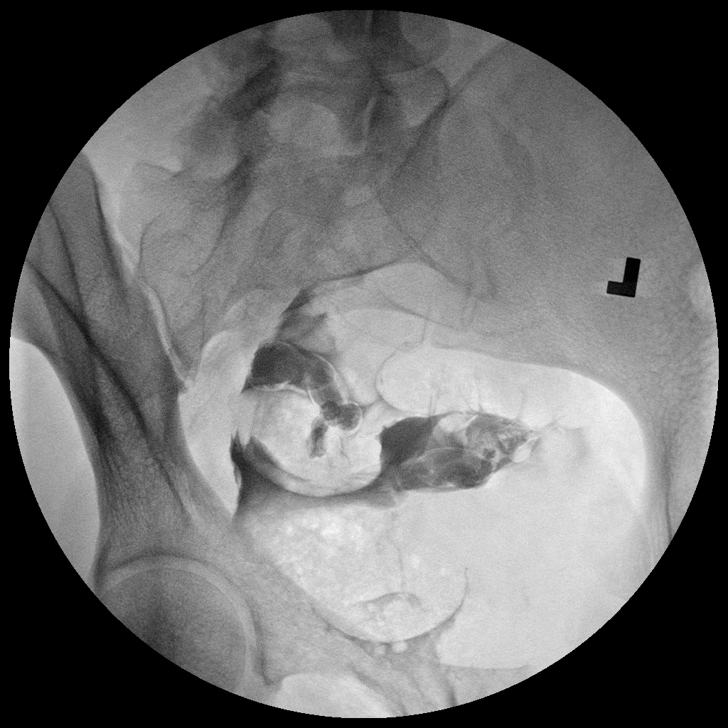

[8 of 8 positions shown; findings below may reference images not displayed]

FINDINGS: The endometrial cavity has a normal contour in appearance. No
evidence of Mullerian duct anomaly.

Opacification of both fallopian tubes seen. Mild dilatation of the
distal right fallopian tube is seen. Free intraperitoneal spill of
contrast from both fallopian tubes is demonstrated.
IMPRESSION: Both fallopian tubes are patent.  Mild right hydrosalpinx noted.

Normal appearance of endometrial cavity.

## 2015-07-07 ENCOUNTER — Emergency Department (HOSPITAL_COMMUNITY)
Admission: EM | Admit: 2015-07-07 | Discharge: 2015-07-07 | Disposition: A | Discharge status: Home / Self Care | Payer: Self-pay | Attending: Emergency Medicine | Admitting: Emergency Medicine

## 2015-07-07 ENCOUNTER — Encounter (HOSPITAL_COMMUNITY): Payer: Self-pay | Admitting: *Deleted

## 2015-07-07 DIAGNOSIS — Z79891 Long term (current) use of opiate analgesic: Secondary | ICD-10-CM | POA: Insufficient documentation

## 2015-07-07 DIAGNOSIS — K0381 Cracked tooth: Secondary | ICD-10-CM | POA: Insufficient documentation

## 2015-07-07 DIAGNOSIS — K029 Dental caries, unspecified: Secondary | ICD-10-CM | POA: Insufficient documentation

## 2015-07-07 DIAGNOSIS — Z87891 Personal history of nicotine dependence: Secondary | ICD-10-CM | POA: Insufficient documentation

## 2015-07-07 DIAGNOSIS — Z87448 Personal history of other diseases of urinary system: Secondary | ICD-10-CM | POA: Insufficient documentation

## 2015-07-07 DIAGNOSIS — Z8742 Personal history of other diseases of the female genital tract: Secondary | ICD-10-CM | POA: Insufficient documentation

## 2015-07-07 DIAGNOSIS — Z7952 Long term (current) use of systemic steroids: Secondary | ICD-10-CM | POA: Insufficient documentation

## 2015-07-07 MED ORDER — PENICILLIN V POTASSIUM 500 MG PO TABS: 500.0000 mg | ORAL_TABLET | Freq: Three times a day (TID) | ORAL | Status: DC

## 2015-07-07 MED ORDER — IBUPROFEN 400 MG PO TABS
800.0000 mg | ORAL_TABLET | Freq: Once | ORAL | Status: AC
  Administered 2015-07-07: 800 mg via ORAL
  Filled 2015-07-07: qty 2

## 2015-07-07 MED ORDER — IBUPROFEN 800 MG PO TABS: 800.0000 mg | ORAL_TABLET | Freq: Three times a day (TID) | ORAL | Status: DC

## 2015-07-07 NOTE — Discharge Instructions (Signed)
Follow up with a dentist tomorrow for further management of your dental pain  Dental Caries Dental caries (also called tooth decay) is the most common oral disease. It can occur at any age but is more common in children and young adults.  HOW DENTAL CARIES DEVELOPS  The process of decay begins when bacteria and foods (particularly sugars and starches) combine in your mouth to produce plaque. Plaque is a substance that sticks to the hard, outer surface of a tooth (enamel). The bacteria in plaque produce acids that attack enamel. These acids may also attack the root surface of a tooth (cementum) if it is exposed. Repeated attacks dissolve these surfaces and create holes in the tooth (cavities). If left untreated, the acids destroy the other layers of the tooth.  RISK FACTORS  Frequent sipping of sugary beverages.   Frequent snacking on sugary and starchy foods, especially those that easily get stuck in the teeth.   Poor oral hygiene.   Dry mouth.   Substance abuse such as methamphetamine abuse.   Broken or poor-fitting dental restorations.   Eating disorders.   Gastroesophageal reflux disease (GERD).   Certain radiation treatments to the head and neck. SYMPTOMS In the early stages of dental caries, symptoms are seldom present. Sometimes white, chalky areas may be seen on the enamel or other tooth layers. In later stages, symptoms may include:  Pits and holes on the enamel.  Toothache after sweet, hot, or cold foods or drinks are consumed.  Pain around the tooth.  Swelling around the tooth. DIAGNOSIS  Most of the time, dental caries is detected during a regular dental checkup. A diagnosis is made after a thorough medical and dental history is taken and the surfaces of your teeth are checked for signs of dental caries. Sometimes special instruments, such as lasers, are used to check for dental caries. Dental X-ray exams may be taken so that areas not visible to the eye (such  as between the contact areas of the teeth) can be checked for cavities.  TREATMENT  If dental caries is in its early stages, it may be reversed with a fluoride treatment or an application of a remineralizing agent at the dental office. Thorough brushing and flossing at home is needed to aid these treatments. If it is in its later stages, treatment depends on the location and extent of tooth destruction:   If a small area of the tooth has been destroyed, the destroyed area will be removed and cavities will be filled with a material such as gold, silver amalgam, or composite resin.   If a large area of the tooth has been destroyed, the destroyed area will be removed and a cap (crown) will be fitted over the remaining tooth structure.   If the center part of the tooth (pulp) is affected, a procedure called a root canal will be needed before a filling or crown can be placed.   If most of the tooth has been destroyed, the tooth may need to be pulled (extracted). HOME CARE INSTRUCTIONS You can prevent, stop, or reverse dental caries at home by practicing good oral hygiene. Good oral hygiene includes:  Thoroughly cleaning your teeth at least twice a day with a toothbrush and dental floss.   Using a fluoride toothpaste. A fluoride mouth rinse may also be used if recommended by your dentist or health care provider.   Restricting the amount of sugary and starchy foods and sugary liquids you consume.   Avoiding frequent snacking on  these foods and sipping of these liquids.   Keeping regular visits with a dentist for checkups and cleanings. PREVENTION   Practice good oral hygiene.  Consider a dental sealant. A dental sealant is a coating material that is applied by your dentist to the pits and grooves of teeth. The sealant prevents food from being trapped in them. It may protect the teeth for several years.  Ask about fluoride supplements if you live in a community without fluorinated water  or with water that has a low fluoride content. Use fluoride supplements as directed by your dentist or health care provider.  Allow fluoride varnish applications to teeth if directed by your dentist or health care provider.   This information is not intended to replace advice given to you by your health care provider. Make sure you discuss any questions you have with your health care provider.   Document Released: 03/07/2002 Document Revised: 07/06/2014 Document Reviewed: 06/17/2012 Elsevier Interactive Patient Education [employer]2016 Else[employer] Inc.

## 2015-07-07 NOTE — ED Notes (Signed)
Declined W/C at D/C and was escorted to lobby by RN. 

## 2015-07-07 NOTE — ED Notes (Signed)
Pty reports a piece of the tooth broke off last week.

## 2015-07-07 NOTE — ED Provider Notes (Signed)
CSN: [id number]     Arrival date & time 07/07/15  1609 History   First MD Initiated Contact with Patient 07/07/15 1715     No chief complaint on file.    (Consider location/radiation/quality/duration/timing/severity/associated sxs/prior Treatment) HPI   35 year old female presents for evaluation of dental pain. Patient report for the past several weeks she has been experiencing recurrent pain to her left upper tooth. Pain started shortly after she chipped a tooth. She described pain as a sharp throbbing sensation worsening with chewing with temperature changes. She has been taking Tylenol without adequate relief. She is currently awaiting for her dental insurance to kick in but the pain is becoming unbearable. She denies having fever, ear pain, hearing changes, throat swelling, neck pain, or rash. Patient is a smoker.  Past Medical History  Diagnosis Date  . Ovarian cyst rupture 2009  . Kidney infection 2010   No past surgical history on file. No family history on file. Social History  Substance Use Topics  . Smoking status: Former Games developermoker  . Smokeless tobacco: Not on file  . Alcohol Use: No   OB History    No data available     Review of Systems  Constitutional: Negative for fever.  HENT: Positive for dental problem.   Skin: Negative for rash and wound.  Neurological: Negative for numbness.      Allergies  Review of patient's allergies indicates no known allergies.  Home Medications   Prior to Admission medications   Medication Sig Start Date End Date Taking? Authorizing Provider  butalbital-acetaminophen-caffeine (FIORICET) 50-325-40 MG per tablet Take 1-2 tablets by mouth every 6 (six) hours as needed for pain or headache. 01/08/13   Hayden Rasmussenavid Mabe, NP  ibuprofen (ADVIL,MOTRIN) 200 MG tablet Take 400 mg by mouth every 6 (six) hours as needed. PAIN    Historical Provider, MD  oxyCODONE-acetaminophen (PERCOCET/ROXICET) 5-325 MG per tablet 1 to 2 tabs PO q6hrs  PRN for pain  06/12/12   Joni ReiningNicole Pisciotta, PA-C  traMADol (ULTRAM) 50 MG tablet Take 1 tablet (50 mg total) by mouth every 6 (six) hours as needed. 08/19/13   Graylon GoodZachary H Baker, PA-C  trimethoprim-polymyxin b (POLYTRIM) ophthalmic solution Place 1 drop into the right eye every 4 (four) hours. 10/15/12   Hayden Rasmussenavid Mabe, NP   BP 129/74 mmHg  Pulse 76  Temp(Src) 98.2 F (36.8 C) (Oral)  Resp 18  SpO2 100% Physical Exam  Constitutional: She appears well-developed and well-nourished. No distress.  HENT:  Head: Atraumatic.  Mouth: Tenderness to tooth #15 with evidence of dental decay. No gingivitis and no obvious abscess. No trismus.  Eyes: Conjunctivae are normal.  Neck: Neck supple.  Lymphadenopathy:    She has no cervical adenopathy.  Neurological: She is alert.  Skin: No rash noted.  Psychiatric: She has a normal mood and affect.  Nursing note and vitals reviewed.   ED Course  Procedures (including critical care time) Labs Review Labs Reviewed - No data to display  Imaging Review No results found. I have personally reviewed and evaluated these images and lab results as part of my medical decision-making.   EKG Interpretation None      MDM   Final diagnoses:  Pain due to dental caries    BP 129/74 mmHg  Pulse 76  Temp(Src) 98.2 F (36.8 C) (Oral)  Resp 18  SpO2 100%   Patient with dentalgia.  No abscess requiring immediate incision and drainage.  Exam not concerning for Ludwig's angina or pharyngeal abscess.  Will treat with PCN and ibuprofen. Pt instructed to follow-up with dentist.  Discussed return precautions. Pt safe for discharge.   Fayrene Helper, PA-C 07/07/15 1723  Tilden Fossa, MD 07/08/15 [phone]

## 2018-07-10 ENCOUNTER — Encounter (HOSPITAL_COMMUNITY): Payer: Self-pay

## 2018-07-10 ENCOUNTER — Emergency Department (HOSPITAL_COMMUNITY)
Admission: EM | Admit: 2018-07-10 | Discharge: 2018-07-10 | Disposition: A | Discharge status: Home / Self Care | Payer: Self-pay | Attending: Emergency Medicine | Admitting: Emergency Medicine

## 2018-07-10 DIAGNOSIS — J101 Influenza due to other identified influenza virus with other respiratory manifestations: Secondary | ICD-10-CM | POA: Insufficient documentation

## 2018-07-10 DIAGNOSIS — Z87891 Personal history of nicotine dependence: Secondary | ICD-10-CM | POA: Insufficient documentation

## 2018-07-10 DIAGNOSIS — J111 Influenza due to unidentified influenza virus with other respiratory manifestations: Secondary | ICD-10-CM

## 2018-07-10 DIAGNOSIS — Z79899 Other long term (current) drug therapy: Secondary | ICD-10-CM | POA: Insufficient documentation

## 2018-07-10 DIAGNOSIS — R69 Illness, unspecified: Secondary | ICD-10-CM

## 2018-07-10 MED ORDER — OSELTAMIVIR PHOSPHATE 75 MG PO CAPS: 75.0000 mg | ORAL_CAPSULE | Freq: Two times a day (BID) | ORAL | 0 refills | Status: DC

## 2018-07-10 MED ORDER — ACETAMINOPHEN 325 MG PO TABS
650.0000 mg | ORAL_TABLET | Freq: Once | ORAL | Status: AC
Start: 2018-07-10 — End: 2018-07-10
  Administered 2018-07-10: 650 mg via ORAL
  Filled 2018-07-10: qty 2

## 2018-07-10 MED ORDER — DM-GUAIFENESIN ER 30-600 MG PO TB12
1.0000 | ORAL_TABLET | Freq: Two times a day (BID) | ORAL | 0 refills | Status: AC
Start: 2018-07-10 — End: ?

## 2018-07-10 MED ORDER — IBUPROFEN 600 MG PO TABS: 600.0000 mg | ORAL_TABLET | Freq: Four times a day (QID) | ORAL | 0 refills | Status: DC | PRN

## 2018-07-10 MED ORDER — DM-GUAIFENESIN ER 30-600 MG PO TB12: 1.0000 | ORAL_TABLET | Freq: Two times a day (BID) | ORAL | 0 refills | Status: DC

## 2018-07-10 NOTE — ED Triage Notes (Signed)
Pt c/o nonproductive cough x2 days and fever

## 2018-07-10 NOTE — ED Provider Notes (Signed)
[facility] Note   CSN: [id number] Arrival date & time: 07/10/18  1939     History   Chief Complaint Chief Complaint  Patient presents with  . Fever  . Cough    HPI Audley HoseKovaundey W Bridge is a 38 y.o. female who presents with fever.  No significant past medical history.  The patient states that for the past 2 days she has had fever, chills, body aches, runny nose, nonproductive cough.  She has been treating her symptoms with over-the-counter medicines but felt particularly bad today so decided to come to the ED.  She was unable to complete her shift today.  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  She has not had a flu shot.  She denies chest pain, shortness of breath, abdominal pain.  HPI  Past Medical History:  Diagnosis Date  . Kidney infection 2010  . Ovarian cyst rupture 2009    There are no active problems to display for this patient.   History reviewed. No pertinent surgical history.   OB History   No obstetric history on file.      Home Medications    Prior to Admission medications   Medication Sig Start Date End Date Taking? Authorizing Provider  dextromethorphan-guaiFENesin (MUCINEX DM) 30-600 MG 12hr tablet Take 1 tablet by mouth 2 (two) times daily. 07/10/18   Bethel BornGekas, Kelly Marie, PA-C  ibuprofen (ADVIL,MOTRIN) 600 MG tablet Take 1 tablet (600 mg total) by mouth every 6 (six) hours as needed. 07/10/18   Bethel BornGekas, Kelly Marie, PA-C  oseltamivir (TAMIFLU) 75 MG capsule Take 1 capsule (75 mg total) by mouth every 12 (twelve) hours. 07/10/18   Bethel BornGekas, Kelly Marie, PA-C    Family History History reviewed. No pertinent family history.  Social History Social History   Tobacco Use  . Smoking status: Former Games developermoker  . Smokeless tobacco: Never Used  Substance Use Topics  . Alcohol use: No  . Drug use: No     Allergies   Other   Review of Systems Review of Systems  Constitutional: Positive  for chills and fever.  HENT: Positive for congestion and rhinorrhea.   Respiratory: Positive for cough. Negative for shortness of breath.   Cardiovascular: Negative for chest pain.  Gastrointestinal: Negative for abdominal pain.  Musculoskeletal: Positive for myalgias.     Physical Exam Updated Vital Signs BP 128/80   Pulse (!) 115   Temp (!) 100.7 F (38.2 C) (Oral)   Resp 18   LMP 06/29/2018   SpO2 98%   Physical Exam Vitals signs and nursing note reviewed.  Constitutional:      General: She is not in acute distress.    Appearance: She is well-developed. She is ill-appearing.     Comments: Calm and cooperative.  Fatigued appearing  HENT:     Head: Normocephalic and atraumatic.     Right Ear: Tympanic membrane normal.     Left Ear: Tympanic membrane normal.     Nose: Rhinorrhea present.     Mouth/Throat:     Mouth: Mucous membranes are moist.     Pharynx: Posterior oropharyngeal erythema present.  Eyes:     General: No scleral icterus.       Right eye: No discharge.        Left eye: No discharge.     Conjunctiva/sclera: Conjunctivae normal.     Pupils: Pupils are equal, round, and reactive to light.  Neck:  Musculoskeletal: Normal range of motion.  Cardiovascular:     Rate and Rhythm: Tachycardia present.  Pulmonary:     Effort: Pulmonary effort is normal. No respiratory distress.     Breath sounds: Normal breath sounds.  Abdominal:     General: There is no distension.  Skin:    General: Skin is warm and dry.  Neurological:     Mental Status: She is alert and oriented to person, place, and time.  Psychiatric:        Behavior: Behavior normal.      ED Treatments / Results  Labs (all labs ordered are listed, but only abnormal results are displayed) Labs Reviewed - No data to display  EKG None  Radiology No results found.  Procedures Procedures (including critical care time)  Medications Ordered in ED Medications  acetaminophen (TYLENOL)  tablet 650 mg (650 mg Oral Given 07/10/18 2010)     Initial Impression / Assessment and Plan / ED Course  I have reviewed the triage vital signs and the nursing notes.  Pertinent labs & imaging results that were available during my care of the patient were reviewed by me and considered in my medical decision making (see chart for details).  38 year old female presents with fever and flulike symptoms.  She is febrile here in the ED and mildly tachycardic.  Her exam is consistent with the flu.  Symptoms have been present for 2 days after a prescription for Tamiflu which she would like to try.  She was also given a prescription for ibuprofen and a cough medicine.  She was given a work note and advised to stay out of work until she is afebrile.  She was given return precautions if she is not improving.  Final Clinical Impressions(s) / ED Diagnoses   Final diagnoses:  Influenza-like illness    ED Discharge Orders         Ordered    ibuprofen (ADVIL,MOTRIN) 600 MG tablet  Every 6 hours PRN     07/10/18 2039    dextromethorphan-guaiFENesin (MUCINEX DM) 30-600 MG 12hr tablet  2 times daily     07/10/18 2039    oseltamivir (TAMIFLU) 75 MG capsule  Every 12 hours     07/10/18 2039           Bethel BornGekas, Kelly Marie, PA-C 07/10/18 2054    Gerhard MunchLockwood, Robert, MD 07/10/18 2313

## 2018-07-10 NOTE — Discharge Instructions (Signed)
Please rest and hydrate Take Ibuprofen for fever and body aches Take cough medicine as prescribed Please return if worsening

## 2018-07-10 NOTE — ED Notes (Signed)
Pt stable, ambulatory, states understanding of discharge instructions 

## 2020-02-28 ENCOUNTER — Ambulatory Visit (HOSPITAL_COMMUNITY)
Admission: EM | Admit: 2020-02-28 | Discharge: 2020-02-28 | Disposition: A | Discharge status: Home / Self Care | Payer: 59 | Attending: Family Medicine | Admitting: Family Medicine

## 2020-02-28 ENCOUNTER — Encounter (HOSPITAL_COMMUNITY): Payer: Self-pay

## 2020-02-28 ENCOUNTER — Telehealth (HOSPITAL_COMMUNITY): Payer: Self-pay

## 2020-02-28 ENCOUNTER — Other Ambulatory Visit: Payer: Self-pay

## 2020-02-28 DIAGNOSIS — H16002 Unspecified corneal ulcer, left eye: Secondary | ICD-10-CM | POA: Diagnosis not present

## 2020-02-28 MED ORDER — OFLOXACIN 0.3 % OP SOLN: 1.0000 [drp] | Freq: Four times a day (QID) | OPHTHALMIC | 0 refills | Status: AC

## 2020-02-28 MED ORDER — TOBRAMYCIN 0.3 % OP OINT: 1.0000 "application " | TOPICAL_OINTMENT | Freq: Four times a day (QID) | OPHTHALMIC | 0 refills | Status: DC

## 2020-02-28 MED ORDER — TOBRAMYCIN 0.3 % OP SOLN: 2.0000 [drp] | OPHTHALMIC | 0 refills | Status: AC

## 2020-02-28 NOTE — Telephone Encounter (Signed)
Pt called and stated that the tobramycin ointment Rx was cost prohibitive either with her insurance co-pay or GoodRx, over $200. Per Marilynn Rail, can switch Rx to tobramycin drops or Ofloxacin ointment/drops whichever is the most cost effective. Per [facility] pharmacist, need new Rx for tobramycin drops be sent before can determine actual cost for patient. Information given to provider for new Rx. Will contact patient once determine which Rx is provided.

## 2020-02-28 NOTE — ED Triage Notes (Signed)
Pt presents with left eye irritation since yesterday.

## 2020-02-28 NOTE — Discharge Instructions (Addendum)
Start the ointment I have provided.  Please call and follow up with your eye doctor in the next two days for recheck.  Return here if any worsening

## 2020-02-28 NOTE — ED Provider Notes (Signed)
[facility]    CSN: [id number] Arrival date & time: 02/28/20  1422      History   Chief Complaint Chief Complaint  Patient presents with   Eye Problem    HPI Jillian [name] is a 39 y.o. female.   Jillian [name] presents with complaints of left eye redness and irritation. Woke this morning with eye swelling and redness. Sensation of FB. Irrigated eye which didn't help with symptoms. Warm compresses haven't helped. Blinking is irritating. Feels most discomfort to medial aspect of eye ball. Clear tearing, no discharge. Wears contacts, took out left contact last night. Wears daily contacts, sometimes she does sleep in them if she is too tired to remove when she gets home. No vision changes or vision loss. No fevers. Has an eye doctor- eye image. No specific known foreign body or exposure to the eye. No medications to eye. Some nasal drainage. No other URI symptoms. Has had similar in the past with corneal abrasion. Mild photophobia.   ROS per HPI, negative if not otherwise mentioned.      Past Medical History:  Diagnosis Date   Kidney infection 2010   Ovarian cyst rupture 2009    There are no problems to display for this patient.   History reviewed. No pertinent surgical history.  OB History   No obstetric history on file.      Home Medications    Prior to Admission medications   Medication Sig Start Date End Date Taking? Authorizing Provider  dextromethorphan-guaiFENesin (MUCINEX DM) 30-600 MG 12hr tablet Take 1 tablet by mouth 2 (two) times daily. 07/10/18   Bethel Born, PA-C  ibuprofen (ADVIL,MOTRIN) 600 MG tablet Take 1 tablet (600 mg total) by mouth every 6 (six) hours as needed. 07/10/18   Bethel Born, PA-C  ofloxacin (OCUFLOX) 0.3 % ophthalmic solution Place 1 drop into the left eye 4 (four) times daily for 7 days. 02/28/20 03/06/20  Georgetta Haber, NP  oseltamivir (TAMIFLU) 75 MG capsule Take 1 capsule (75 mg total) by mouth  every 12 (twelve) hours. 07/10/18   Bethel Born, PA-C  tobramycin (TOBREX) 0.3 % ophthalmic solution Place 2 drops into the left eye every 4 (four) hours for 7 days. 02/28/20 03/06/20  Georgetta Haber, NP    Family History Family History  Family history unknown: Yes    Social History Social History   Tobacco Use   Smoking status: Former Smoker   Smokeless tobacco: Never Used  Substance Use Topics   Alcohol use: No   Drug use: No     Allergies   Other   Review of Systems Review of Systems   Physical Exam Triage Vital Signs ED Triage Vitals  Enc Vitals Group     BP 02/28/20 1558 138/84     Pulse Rate 02/28/20 1558 75     Resp 02/28/20 1558 18     Temp 02/28/20 1558 98.5 F (36.9 C)     Temp Source 02/28/20 1558 Oral     SpO2 02/28/20 1558 99 %     Weight --      Height --      Head Circumference --      Peak Flow --      Pain Score 02/28/20 1559 7     Pain Loc --      Pain Edu? --      Excl. in GC? --    No data found.  Updated Vital Signs BP  138/84 (BP Location: Right Arm)    Pulse 75    Temp 98.5 F (36.9 C) (Oral)    Resp 18    LMP 02/25/2020    SpO2 99%   Visual Acuity Right Eye Distance:   Left Eye Distance:   Bilateral Distance:    Right Eye Near:   Left Eye Near:    Bilateral Near:     Physical Exam Constitutional:      General: She is not in acute distress.    Appearance: She is well-developed.  Eyes:     General: Vision grossly intact. Gaze aligned appropriately.        Left eye: No foreign body or discharge.     Extraocular Movements: Extraocular movements intact.     Conjunctiva/sclera:     Left eye: Left conjunctiva is injected.     Slit lamp exam:    Left eye: Corneal ulcer present.      Comments: Clear tearing noted with quite impressive generalized injection to left eye with mild upper lid swelling noted; concern for corneal ulcer medial to iris   Cardiovascular:     Rate and Rhythm: Normal rate.  Pulmonary:      Effort: Pulmonary effort is normal.  Skin:    General: Skin is warm and dry.  Neurological:     Mental Status: She is alert and oriented to person, place, and time.      UC Treatments / Results  Labs (all labs ordered are listed, but only abnormal results are displayed) Labs Reviewed - No data to display  EKG   Radiology No results found.  Procedures Procedures (including critical care time)  Medications Ordered in UC Medications - No data to display  Initial Impression / Assessment and Plan / UC Course  I have reviewed the triage vital signs and the nursing notes.  Pertinent labs & imaging results that were available during my care of the patient were reviewed by me and considered in my medical decision making (see chart for details).     Exam is concerning for corneal ulcer. Antibiotic ointment/ drops (pending affordability) with strict follow up with opthalmology discussed. Patient verbalized understanding and agreeable to plan.   Final Clinical Impressions(s) / UC Diagnoses   Final diagnoses:  Ulcer of left cornea     Discharge Instructions     Start the ointment I have provided.  Please call and follow up with your eye doctor in the next two days for recheck.  Return here if any worsening     ED Prescriptions    Medication Sig Dispense Auth. Provider   tobramycin (TOBREX) 0.3 % ophthalmic ointment Place 1 application into the left eye 4 (four) times daily for 7 days. 3.5 g Georgetta Haber, NP     PDMP not reviewed this encounter.   Georgetta Haber, NP 02/28/20 1826

## 2023-12-15 ENCOUNTER — Encounter (HOSPITAL_COMMUNITY): Payer: Self-pay | Admitting: Emergency Medicine

## 2023-12-15 ENCOUNTER — Ambulatory Visit (HOSPITAL_COMMUNITY)
Admission: EM | Admit: 2023-12-15 | Discharge: 2023-12-15 | Disposition: A | Discharge status: Home / Self Care | Attending: Family Medicine | Admitting: Family Medicine

## 2023-12-15 DIAGNOSIS — K0889 Other specified disorders of teeth and supporting structures: Secondary | ICD-10-CM | POA: Diagnosis not present

## 2023-12-15 MED ORDER — IBUPROFEN 800 MG PO TABS: 800.0000 mg | ORAL_TABLET | Freq: Three times a day (TID) | ORAL | 0 refills | Status: DC

## 2023-12-15 MED ORDER — AMOXICILLIN-POT CLAVULANATE 875-125 MG PO TABS: 1.0000 | ORAL_TABLET | Freq: Two times a day (BID) | ORAL | 0 refills | Status: DC

## 2023-12-15 NOTE — ED Triage Notes (Signed)
 Pt reports that Monday at work she was eating some chips and tooth broke. Having pain and now swelling on right side since then. Took extra strength tylenol , BC powder, ibuprofen  not helping.

## 2023-12-15 NOTE — ED Provider Notes (Signed)
  [facility]   [id number] 12/15/23 Arrival Time: 1223  ASSESSMENT & PLAN:  1. Pain, dental    No sign of abscess requiring I&D at this time. Discussed.  Meds ordered this encounter  Medications   amoxicillin-clavulanate (AUGMENTIN) 875-125 MG tablet    Sig: Take 1 tablet by mouth every 12 (twelve) hours.    Dispense:  14 tablet    Refill:  0   ibuprofen  (ADVIL ) 800 MG tablet    Sig: Take 1 tablet (800 mg total) by mouth 3 (three) times daily with meals.    Dispense:  21 tablet    Refill:  0   She is working to est care with dentist. May f/u here as needed.  Reviewed expectations re: course of current medical issues. Questions answered. Outlined signs and symptoms indicating need for more acute intervention. Patient verbalized understanding. After Visit Summary given.   SUBJECTIVE:  Jillian [name] is a 43 y.o. female who reports Pt reports that Monday at work she was eating some chips and tooth broke. Having pain and now swelling on right side since then. Took extra strength tylenol , BC powder, ibuprofen  not helping. Denies fever. Tolerating PO intake.   OBJECTIVE: Vitals:   12/15/23 1236  BP: (!) 147/89  Pulse: 94  Resp: 16  Temp: 98.1 F (36.7 C)  TempSrc: Oral  SpO2: 96%    General appearance: alert; no distress HENT: normocephalic; atraumatic; dentition: fair; left lower rear gum without areas of fluctuance, drainage, or bleeding and with tenderness to palpation; normal jaw movement without difficulty Neck: supple without LAD; FROM; trachea midline Lungs: normal respirations; unlabored; speaks full sentences without difficulty Skin: warm and dry Psychological: alert and cooperative; normal mood and affect  Allergies  Allergen Reactions   Other Anaphylaxis    Oral swelling when eating shell fish    Past Medical History:  Diagnosis Date   Kidney infection 2010   Ovarian cyst rupture 2009   Social History   Socioeconomic History    Marital status: Single    Spouse name: Not on file   Number of children: Not on file   Years of education: Not on file   Highest education level: Not on file  Occupational History   Not on file  Tobacco Use   Smoking status: Former   Smokeless tobacco: Never  Substance and Sexual Activity   Alcohol use: No   Drug use: No   Sexual activity: Not on file  Other Topics Concern   Not on file  Social History Narrative   Not on file   Social Drivers of Health   Financial Resource Strain: Not on file  Food Insecurity: Not on file  Transportation Needs: Not on file  Physical Activity: Not on file  Stress: Not on file  Social Connections: Not on file  Intimate Partner Violence: Not on file   Family History  Family history unknown: Yes   History reviewed. No pertinent surgical history.    Afton Albright, MD 12/15/23 [phone]

## 2024-07-28 ENCOUNTER — Other Ambulatory Visit: Payer: Self-pay

## 2024-07-28 ENCOUNTER — Ambulatory Visit (HOSPITAL_COMMUNITY)
Admission: EM | Admit: 2024-07-28 | Discharge: 2024-07-28 | Disposition: A | Discharge status: Home / Self Care | Attending: Family Medicine | Admitting: Family Medicine

## 2024-07-28 ENCOUNTER — Encounter (HOSPITAL_COMMUNITY): Payer: Self-pay | Admitting: Emergency Medicine

## 2024-07-28 DIAGNOSIS — G4486 Cervicogenic headache: Secondary | ICD-10-CM | POA: Diagnosis not present

## 2024-07-28 DIAGNOSIS — M542 Cervicalgia: Secondary | ICD-10-CM | POA: Diagnosis not present

## 2024-07-28 MED ORDER — TIZANIDINE HCL 4 MG PO TABS: 4.0000 mg | ORAL_TABLET | Freq: Three times a day (TID) | ORAL | 0 refills | Status: AC | PRN

## 2024-07-28 MED ORDER — KETOROLAC TROMETHAMINE 30 MG/ML IJ SOLN
30.0000 mg | Freq: Once | INTRAMUSCULAR | Status: AC
  Administered 2024-07-28: 30 mg via INTRAMUSCULAR

## 2024-07-28 MED ORDER — KETOROLAC TROMETHAMINE 10 MG PO TABS: 10.0000 mg | ORAL_TABLET | Freq: Four times a day (QID) | ORAL | 0 refills | Status: AC | PRN

## 2024-07-28 MED ORDER — KETOROLAC TROMETHAMINE 30 MG/ML IJ SOLN
INTRAMUSCULAR | Status: AC
  Filled 2024-07-28: qty 1

## 2024-07-28 NOTE — Discharge Instructions (Signed)
 You have been given a shot of Toradol  30 mg today.  Ketorolac  10 mg tablets--take 1 tablet every 6 hours as needed for pain.  This is the same medicine that is in the shot we just gave you   Take tizanidine  4 mg--1 every 8 hours as needed for muscle spasms; this medication can cause dizziness and sleepiness  Please do not take ibuprofen  or Aleve or aspirin/BC with the ketorolac .  Tylenol  is safe to take if you need something extra for pain or fever, with  You can go to the website [email or web address] to schedule yourself a new patient appointment with primary care

## 2024-07-28 NOTE — ED Triage Notes (Signed)
 Headache /fullness from back of head to front of face for a week. Tried NSAIds including bc powder. No fever, no cough
